# Patient Record
Sex: Female | Born: 1995 | Hispanic: No | Marital: Single | Smoking: Never smoker
Health system: Southern US, Community
[De-identification: ages and names within clinical notes are randomized; demographics above are authoritative.]

## PROBLEM LIST (undated history)

## (undated) DIAGNOSIS — Z309 Encounter for contraceptive management, unspecified: Secondary | ICD-10-CM

## (undated) HISTORY — DX: Encounter for contraceptive management, unspecified: Z30.9

---
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
## 2016-10-08 ENCOUNTER — Encounter: Payer: Self-pay | Admitting: Adult Health

## 2016-10-08 ENCOUNTER — Ambulatory Visit (INDEPENDENT_AMBULATORY_CARE_PROVIDER_SITE_OTHER): Payer: BLUE CROSS/BLUE SHIELD | Admitting: Adult Health

## 2016-10-08 VITALS — BP 132/60 | HR 82 | Ht 63.0 in | Wt 137.5 lb

## 2016-10-08 DIAGNOSIS — Z3041 Encounter for surveillance of contraceptive pills: Secondary | ICD-10-CM

## 2016-10-08 DIAGNOSIS — R635 Abnormal weight gain: Secondary | ICD-10-CM | POA: Diagnosis not present

## 2016-10-08 DIAGNOSIS — F329 Major depressive disorder, single episode, unspecified: Secondary | ICD-10-CM

## 2016-10-08 DIAGNOSIS — F32A Depression, unspecified: Secondary | ICD-10-CM

## 2016-10-08 MED ORDER — NORETHIN ACE-ETH ESTRAD-FE 1-20 MG-MCG PO TABS: 1.0000 | ORAL_TABLET | Freq: Every day | ORAL | 11 refills | Status: DC

## 2016-10-08 MED ORDER — ESCITALOPRAM OXALATE 10 MG PO TABS: 10.0000 mg | ORAL_TABLET | Freq: Every day | ORAL | 6 refills | Status: DC

## 2016-10-08 NOTE — Patient Instructions (Signed)
Major Depressive Disorder, Adult Major depressive disorder (MDD) is a mental health condition. It may also be called clinical depression or unipolar depression. MDD usually causes feelings of sadness, hopelessness, or helplessness. MDD can also cause physical symptoms. It can interfere with work, school, relationships, and other everyday activities. MDD may be mild, moderate, or severe. It may occur once (single episode major depressive disorder) or it may occur multiple times (recurrent major depressive disorder). What are the causes? The exact cause of this condition is not known. MDD is most likely caused by a combination of things, which may include:  Genetic factors. These are traits that are passed along from parent to child.  Individual factors. Your personality, your behavior, and the way you handle your thoughts and feelings may contribute to MDD. This includes personality traits and behaviors learned from others.  Physical factors, such as:  Differences in the part of your brain that controls emotion. This part of your brain may be different than it is in people who do not have MDD.  Long-term (chronic) medical or psychiatric illnesses.  Social factors. Traumatic experiences or major life changes may play a role in the development of MDD. What increases the risk? This condition is more likely to develop in women. The following factors may also make you more likely to develop MDD:  A family history of depression.  Troubled family relationships.  Abnormally low levels of certain brain chemicals.  Traumatic events in childhood, especially abuse or the loss of a parent.  Being under a lot of stress, or long-term stress, especially from upsetting life experiences or losses.  A history of:  Chronic physical illness.  Other mental health disorders.  Substance abuse.  Poor living conditions.  Experiencing social exclusion or discrimination on a regular basis. What are the  signs or symptoms? The main symptoms of MDD typically include:  Constant depressed or irritable mood.  Loss of interest in things and activities. MDD symptoms may also include:  Sleeping or eating too much or too little.  Unexplained weight change.  Fatigue or low energy.  Feelings of worthlessness or guilt.  Difficulty thinking clearly or making decisions.  Thoughts of suicide or of harming others.  Physical agitation or weakness.  Isolation. Severe cases of MDD may also occur with other symptoms, such as:  Delusions or hallucinations, in which you imagine things that are not real (psychotic depression).  Low-level depression that lasts at least a year (chronic depression or persistent depressive disorder).  Extreme sadness and hopelessness (melancholic depression).  Trouble speaking and moving (catatonic depression). How is this diagnosed? This condition may be diagnosed based on:  Your symptoms.  Your medical history, including your mental health history. This may involve tests to evaluate your mental health. You may be asked questions about your lifestyle, including any drug and alcohol use, and how long you have had symptoms of MDD.  A physical exam.  Blood tests to rule out other conditions. You must have a depressed mood and at least four other MDD symptoms most of the day, nearly every day in the same 2-week timeframe before your health care provider can confirm a diagnosis of MDD. How is this treated? This condition is usually treated by mental health professionals, such as psychologists, psychiatrists, and clinical social workers. You may need more than one type of treatment. Treatment may include:  Psychotherapy. This is also called talk therapy or counseling. Types of psychotherapy include:  Cognitive behavioral therapy (CBT). This type of therapy  teaches you to recognize unhealthy feelings, thoughts, and behaviors, and replace them with positive thoughts  and actions.  Interpersonal therapy (IPT). This helps you to improve the way you relate to and communicate with others.  Family therapy. This treatment includes members of your family.  Medicine to treat anxiety and depression, or to help you control certain emotions and behaviors.  Lifestyle changes, such as:  Limiting alcohol and drug use.  Exercising regularly.  Getting plenty of sleep.  Making healthy eating choices.  Spending more time outdoors. Treatments involving stimulation of the brain can be used in situations with extremely severe symptoms, or when medicine or other therapies do not work over time. These treatments include electroconvulsive therapy, transcranial magnetic stimulation, and vagal nerve stimulation. Follow these instructions at home: Activity  Return to your normal activities as told by your health care provider.  Exercise regularly and spend time outdoors as told by your health care provider. General instructions  Take over-the-counter and prescription medicines only as told by your health care provider.  Do not drink alcohol. If you drink alcohol, limit your alcohol intake to no more than 1 drink a day for nonpregnant women and 2 drinks a day for men. One drink equals 12 oz of beer, 5 oz of wine, or 1 oz of hard liquor. Alcohol can affect any antidepressant medicines you are taking. Talk to your health care provider about your alcohol use.  Eat a healthy diet and get plenty of sleep.  Find activities that you enjoy doing, and make time to do them.  Consider joining a support group. Your health care provider may be able to recommend a support group.  Keep all follow-up visits as told by your health care provider. This is important. Where to find more information: [employer]ness  www.nami.org [employer]  http://www.maynard.net/www.nimh.nih.gov National Suicide Prevention Lifeline  1-800-273-TALK 913-551-4627(8255). This is  free, 24-hour help. Contact a health care provider if:  Your symptoms get worse.  You develop new symptoms. Get help right away if:  You self-harm.  You have serious thoughts about hurting yourself or others.  You see, hear, taste, smell, or feel things that are not present (hallucinate). This information is not intended to replace advice given to you by your health care provider. Make sure you discuss any questions you have with your health care provider. Document Released: 02/23/2013 Document Revised: 07/05/2016 Document Reviewed: 05/09/2016 Elsevier Interactive Patient Education  2017 [employer] Take 1/2 tab of lexparo for 3-4 days then 1 daily Follow up in 5 weeks

## 2016-10-08 NOTE — Progress Notes (Signed)
Subjective:     Patient ID: Raven Collins, female   DOB: [birth year], 20 y.o.   MRN: [id number]  HPI Raven Collins is a 20 year old female in to get refills on birth control and she has weight gain and wants thyroid checked,has always had some constipation issues.She says it runs in her family.She is happy with her birth control.  Review of Systems +weight gain Always has some constipation Reviewed past medical,surgical, social and family history. Reviewed medications and allergies.     Objective:   Physical Exam BP 132/60 (BP Location: Left Arm, Patient Position: Sitting, Cuff Size: Normal)   Pulse 82   Ht 5\' 3"  (1.6 m)   Wt 137 lb 8 oz (62.4 kg)   LMP 09/11/2016 (Approximate)   BMI 24.36 kg/m  Skin warm and dry. Neck: mid line trachea, normal thyroid, good ROM, no lymphadenopathy noted. Lungs: clear to ausculation bilaterally. Cardiovascular: regular rate and rhythm. PHQ 9 score 11, pt denies any suicidal ideations, but has had 2 deaths in family in last year and broke up with boyfriend and is living with friends. Discussed medication and counseling and will start with meds. She says depression runs in her family too. She is going to [place] in mid December to move her Mom here.     Assessment:     1. Encounter for surveillance of contraceptive pills   2. Weight gain   3. Depression, unspecified depression type       Plan:     Check TSH and free T4 Meds ordered this encounter  Medications  . norethindrone-ethinyl estradiol (JUNEL FE 1/20) 1-20 MG-MCG tablet    Sig: Take 1 tablet by mouth daily.    Dispense:  1 Package    Refill:  11    Order Specific Question:   Supervising Provider    Answer:   Despina HiddenEURE, LUTHER H [[id number]]  . escitalopram (LEXAPRO) 10 MG tablet    Sig: Take 1 tablet (10 mg total) by mouth daily.    Dispense:  30 tablet    Refill:  6    Order Specific Question:   Supervising Provider    Answer:   Duane LopeEURE, LUTHER H [[id number]]  Take 1/2 tab lexapro first 3-4 days then 1  daily Follow  Up in 5 weeks, will offer counseling again  Review handout on depression

## 2016-10-09 LAB — T4, FREE: FREE T4: 1.35 ng/dL (ref 0.93–1.60)

## 2016-10-09 LAB — TSH: TSH: 1.76 u[IU]/mL (ref 0.450–4.500)

## 2016-11-14 ENCOUNTER — Ambulatory Visit: Payer: BLUE CROSS/BLUE SHIELD | Admitting: Adult Health

## 2017-06-23 ENCOUNTER — Other Ambulatory Visit: Payer: Self-pay | Admitting: Adult Health

## 2019-12-24 ENCOUNTER — Encounter: Payer: BLUE CROSS/BLUE SHIELD | Admitting: Obstetrics and Gynecology

## 2020-01-06 ENCOUNTER — Encounter: Payer: BLUE CROSS/BLUE SHIELD | Admitting: Obstetrics and Gynecology

## 2020-01-07 ENCOUNTER — Ambulatory Visit (INDEPENDENT_AMBULATORY_CARE_PROVIDER_SITE_OTHER): Payer: BC Managed Care – PPO | Admitting: Obstetrics and Gynecology

## 2020-01-07 ENCOUNTER — Other Ambulatory Visit: Payer: Self-pay

## 2020-01-07 ENCOUNTER — Encounter: Payer: Self-pay | Admitting: Obstetrics and Gynecology

## 2020-01-07 ENCOUNTER — Other Ambulatory Visit (HOSPITAL_COMMUNITY)
Admission: RE | Admit: 2020-01-07 | Discharge: 2020-01-07 | Disposition: A | Discharge status: Home / Self Care | Payer: BC Managed Care – PPO | Source: Ambulatory Visit | Attending: Obstetrics and Gynecology | Admitting: Obstetrics and Gynecology

## 2020-01-07 VITALS — BP 125/78 | HR 69 | Ht 64.0 in | Wt 163.0 lb

## 2020-01-07 DIAGNOSIS — Z8619 Personal history of other infectious and parasitic diseases: Secondary | ICD-10-CM | POA: Insufficient documentation

## 2020-01-07 DIAGNOSIS — N921 Excessive and frequent menstruation with irregular cycle: Secondary | ICD-10-CM | POA: Diagnosis not present

## 2020-01-07 DIAGNOSIS — R87619 Unspecified abnormal cytological findings in specimens from cervix uteri: Secondary | ICD-10-CM | POA: Insufficient documentation

## 2020-01-07 DIAGNOSIS — Z975 Presence of (intrauterine) contraceptive device: Secondary | ICD-10-CM

## 2020-01-07 NOTE — Addendum Note (Signed)
Addended by: Dorian Pod on: 01/07/2020 03:13 PM   Modules accepted: Orders

## 2020-01-07 NOTE — Progress Notes (Signed)
HPI:      Raven Collins is a 24 y.o. G0P0000 who LMP was No LMP recorded. (Menstrual status: IUD).  Subjective:   She presents today with 3 issues.  The first issue is her Mirena IUD.  She had significant bleeding and cramping upon placement to the point where she could not get out of bed.  Over time this has subsided.  Her previous doctor placed her on OCPs to stop the bleeding and this has improved her pain.  She still bleeds a few days per month but is currently on OCPs.  She will finish this pack and then be off of them. The second issue is she was previously diagnosed with chlamydia and treated appropriately as was her partner.  She has not had a test of cure. The third issue is an abnormal Pap smear which showed atypical glandular cells.  This has not been followed up.    Hx: The following portions of the patient's history were reviewed and updated as appropriate:             She  has a past medical history of Contraceptive management (08/29/2015). She does not have any pertinent problems on file. She  has a past surgical history that includes Foot surgery (Left, 2012). Her family history includes Alcohol abuse in her maternal aunt, maternal uncle, maternal uncle, and maternal uncle; Depression in her maternal aunt, maternal uncle, and maternal uncle; Migraines in her mother; Other in her brother and maternal grandmother; Thyroid disease in her maternal grandmother and mother. She  reports that she has never smoked. She has never used smokeless tobacco. She reports current alcohol use. She reports that she does not use drugs. She currently has no medications in their medication list. She has No Known Allergies.       Review of Systems:  Review of Systems  Constitutional: Denied constitutional symptoms, night sweats, recent illness, fatigue, fever, insomnia and weight loss.  Eyes: Denied eye symptoms, eye pain, photophobia, vision change and visual disturbance.  Ears/Nose/Throat/Neck:  Denied ear, nose, throat or neck symptoms, hearing loss, nasal discharge, sinus congestion and sore throat.  Cardiovascular: Denied cardiovascular symptoms, arrhythmia, chest pain/pressure, edema, exercise intolerance, orthopnea and palpitations.  Respiratory: Denied pulmonary symptoms, asthma, pleuritic pain, productive sputum, cough, dyspnea and wheezing.  Gastrointestinal: Denied, gastro-esophageal reflux, melena, nausea and vomiting.  Genitourinary: See HPI for additional information.  Musculoskeletal: Denied musculoskeletal symptoms, stiffness, swelling, muscle weakness and myalgia.  Dermatologic: Denied dermatology symptoms, rash and scar.  Neurologic: Denied neurology symptoms, dizziness, headache, neck pain and syncope.  Psychiatric: Denied psychiatric symptoms, anxiety and depression.  Endocrine: Denied endocrine symptoms including hot flashes and night sweats.   Meds:   No current outpatient medications on file prior to visit.   No current facility-administered medications on file prior to visit.    Objective:     Vitals:   01/07/20 1420  BP: 125/78  Pulse: 69              Physical examination   Pelvic:   Vulva: Normal appearance.  No lesions.  Vagina: No lesions or abnormalities noted.  Support: Normal pelvic support.  Urethra No masses tenderness or scarring.  Meatus Normal size without lesions or prolapse.  Cervix: Normal appearance.  No lesions. IUD strings noted at cervical os.  Anus: Normal exam.  No lesions.  Perineum: Normal exam.  No lesions.        Bimanual   Uterus: Normal size.  Non-tender.  Mobile.  AV.  Adnexae: No masses.  Non-tender to palpation.  Cul-de-sac: Negative for abnormality.     Assessment:    G0P0000 Patient Active Problem List   Diagnosis Date Noted  . Contraceptive management 08/29/2015  . PLANTAR WART, RIGHT 09/15/2008     1. 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 History of  chlamydia   3. Breakthrough bleeding associated with intrauterine device (IUD)        Plan:            1.  Pap performed  2.  GC/CT performed with Pap for test of cure  3.  Expectant management after stopping OCPs.  If bleeding is minimal and no pain with IUD will leave in place.  If pain and bleeding resumed consider ultrasound versus removal and replacement.   Orders No orders of the defined types were placed in this encounter.   No orders of the defined types were placed in this encounter.     F/U  Return for We will contact her with any abnormal test results. I spent 32 minutes involved in the care of this patient preparing to see the patient by obtaining and reviewing her medical history (including labs, imaging tests and prior procedures), documenting clinical information in the electronic health record (EHR), counseling and coordinating care plans, writing and sending prescriptions, ordering tests or procedures and directly communicating with the patient by discussing pertinent items from her history and physical exam as well as detailing my assessment and plan as noted above so that she has an informed understanding.  All of her questions were answered.  Elonda Husky, M.D. 01/07/2020 3:01 PM

## 2020-01-11 LAB — CYTOLOGY - PAP
Chlamydia: NEGATIVE
Comment: NEGATIVE
Comment: NORMAL
Diagnosis: NEGATIVE
Neisseria Gonorrhea: NEGATIVE

## 2020-01-18 ENCOUNTER — Telehealth: Payer: Self-pay | Admitting: Obstetrics and Gynecology

## 2020-01-18 NOTE — Telephone Encounter (Signed)
Spoke with patient to let her know that we did not send any medication in. Patient stated that her pharmacy called and said that RX was ready for pick up. I advised patient to call her pharmacy to discuss what medication it is and who sent in for her. Patient verbalized understanding.

## 2020-01-18 NOTE — Telephone Encounter (Signed)
Pt called and stated that a medication was sent in to her pharmacy. The pt is confused because she was never told about a medication/ pt is requesting a call back. Please advise

## 2020-03-03 ENCOUNTER — Ambulatory Visit: Payer: BC Managed Care – PPO

## 2020-03-03 ENCOUNTER — Ambulatory Visit: Payer: BC Managed Care – PPO | Attending: Internal Medicine

## 2020-03-03 DIAGNOSIS — Z23 Encounter for immunization: Secondary | ICD-10-CM

## 2020-03-03 NOTE — Progress Notes (Signed)
   Covid-19 Vaccination Clinic  Name:  Raven Collins    MRN: [id number] DOB: 1995-12-08  03/03/2020  [name] was observed post Covid-19 immunization for 15 minutes without incident. She was provided with Vaccine Information Sheet and instruction to access the V-Safe system.   [name] was instructed to call 911 with any severe reactions post vaccine: Marland Kitchen Difficulty breathing  . Swelling of face and throat  . A fast heartbeat  . A bad rash all over body  . Dizziness and weakness   Immunizations Administered    Name Date Dose VIS Date Route   Pfizer COVID-19 Vaccine 03/03/2020  8:26 AM 0.3 mL 01/06/2019 Intramuscular   Manufacturer: [employer], [employer]   Lot: [id number]   NDC: [address]-0

## 2020-03-29 ENCOUNTER — Ambulatory Visit: Payer: BC Managed Care – PPO | Attending: Internal Medicine

## 2020-03-29 DIAGNOSIS — Z23 Encounter for immunization: Secondary | ICD-10-CM

## 2020-03-29 NOTE — Progress Notes (Signed)
   Covid-19 Vaccination Clinic  Name:  Raven Collins    MRN: [id number] DOB: [birth year]  03/29/2020  [name] was observed post Covid-19 immunization for 15 minutes without incident. She was provided with Vaccine Information Sheet and instruction to access the V-Safe system.   [name] was instructed to call 911 with any severe reactions post vaccine: Marland Kitchen Difficulty breathing  . Swelling of face and throat  . A fast heartbeat  . A bad rash all over body  . Dizziness and weakness   Immunizations Administered    Name Date Dose VIS Date Route   Pfizer COVID-19 Vaccine 03/29/2020 11:53 AM 0.3 mL 01/06/2019 Intramuscular   Manufacturer: [employer], [employer]   Lot: [id number]   NDC: [address]-5

## 2020-10-26 ENCOUNTER — Encounter: Payer: BC Managed Care – PPO | Admitting: Obstetrics and Gynecology

## 2020-10-27 ENCOUNTER — Encounter: Payer: Self-pay | Admitting: Obstetrics and Gynecology

## 2020-10-27 ENCOUNTER — Ambulatory Visit (INDEPENDENT_AMBULATORY_CARE_PROVIDER_SITE_OTHER): Payer: BC Managed Care – PPO | Admitting: Obstetrics and Gynecology

## 2020-10-27 ENCOUNTER — Other Ambulatory Visit (HOSPITAL_COMMUNITY)
Admission: RE | Admit: 2020-10-27 | Discharge: 2020-10-27 | Disposition: A | Discharge status: Home / Self Care | Payer: BC Managed Care – PPO | Source: Ambulatory Visit | Attending: Obstetrics and Gynecology | Admitting: Obstetrics and Gynecology

## 2020-10-27 ENCOUNTER — Other Ambulatory Visit: Payer: Self-pay

## 2020-10-27 VITALS — BP 114/75 | HR 75 | Ht 64.0 in | Wt 171.3 lb

## 2020-10-27 DIAGNOSIS — Z01419 Encounter for gynecological examination (general) (routine) without abnormal findings: Secondary | ICD-10-CM | POA: Diagnosis not present

## 2020-10-27 DIAGNOSIS — R5383 Other fatigue: Secondary | ICD-10-CM

## 2020-10-27 DIAGNOSIS — Z118 Encounter for screening for other infectious and parasitic diseases: Secondary | ICD-10-CM | POA: Insufficient documentation

## 2020-10-27 DIAGNOSIS — N921 Excessive and frequent menstruation with irregular cycle: Secondary | ICD-10-CM | POA: Diagnosis not present

## 2020-10-27 DIAGNOSIS — Z975 Presence of (intrauterine) contraceptive device: Secondary | ICD-10-CM

## 2020-10-27 NOTE — Progress Notes (Signed)
HPI:      [name] is a 24 y.o. G0P0000 who LMP was No LMP recorded. (Menstrual status: IUD).  Subjective:   She presents today for her annual examination.  She has Mirena IUD for birth control.  She continues to experience at least twice monthly breakthrough bleeding with IUD.  She also has intermittent cramping sometimes associated with the bleeding and sometimes not.  She is not overly happy with her IUD although it is "manageable". Patient desires STD testing-she denies symptoms, denies new sexual partner. Complains of generalized fatigue.  States that no matter how much she sleeps she still feels tired every day.  Also complains of unintended weight gain.  Reports that she is actually eating more healthy than she used to but continues to gain weight.   Hx: The following portions of the patient's history were reviewed and updated as appropriate:             She  has a past medical history of Contraceptive management (08/29/2015). She does not have any pertinent problems on file. She  has a past surgical history that includes Foot surgery (Left, 2012). Her family history includes Alcohol abuse in her maternal aunt, maternal uncle, maternal uncle, and maternal uncle; Depression in her maternal aunt, maternal uncle, and maternal uncle; Migraines in her mother; Other in her brother and maternal grandmother; Thyroid disease in her maternal grandmother and mother. She  reports that she has never smoked. She has never used smokeless tobacco. She reports current alcohol use. She reports that she does not use drugs. She currently has no medications in their medication list. She has No Known Allergies.       Review of Systems:  Review of Systems  Constitutional: Denied constitutional symptoms, night sweats, recent illness, fatigue, fever, insomnia and weight loss.  Eyes: Denied eye symptoms, eye pain, photophobia, vision change and visual disturbance.  Ears/Nose/Throat/Neck: Denied ear, nose,  throat or neck symptoms, hearing loss, nasal discharge, sinus congestion and sore throat.  Cardiovascular: Denied cardiovascular symptoms, arrhythmia, chest pain/pressure, edema, exercise intolerance, orthopnea and palpitations.  Respiratory: Denied pulmonary symptoms, asthma, pleuritic pain, productive sputum, cough, dyspnea and wheezing.  Gastrointestinal: Denied, gastro-esophageal reflux, melena, nausea and vomiting.  Genitourinary: See HPI for additional information.  Musculoskeletal: Denied musculoskeletal symptoms, stiffness, swelling, muscle weakness and myalgia.  Dermatologic: Denied dermatology symptoms, rash and scar.  Neurologic: Denied neurology symptoms, dizziness, headache, neck pain and syncope.  Psychiatric: Denied psychiatric symptoms, anxiety and depression.  Endocrine: See HPI for additional information.   Meds:   No current outpatient medications on file prior to visit.   No current facility-administered medications on file prior to visit.       The pregnancy intention screening data noted above was reviewed. Potential methods of contraception were discussed. The patient elected to proceed with IUD or IUS.     Objective:     Vitals:   10/27/20 0911  BP: 114/75  Pulse: 75    Filed Weights   10/27/20 0911  Weight: 171 lb 4.8 oz (77.7 kg)              Physical examination General NAD, Conversant  HEENT Atraumatic; Op clear with mmm.  Normo-cephalic. Pupils reactive. Anicteric sclerae  Thyroid/Neck Smooth without nodularity or enlargement. Normal ROM.  Neck Supple.  Skin No rashes, lesions or ulceration. Normal palpated skin turgor. No nodularity.  Breasts: No masses or discharge.  Symmetric.  No axillary adenopathy.  Lungs: Clear to auscultation.No rales or  wheezes. Normal Respiratory effort, no retractions.  Heart: NSR.  No murmurs or rubs appreciated. No periferal edema  Abdomen: Soft.  Non-tender.  No masses.  No HSM. No hernia  Extremities: Moves all  appropriately.  Normal ROM for age. No lymphadenopathy.  Neuro: Oriented to PPT.  Normal mood. Normal affect.     Pelvic:   Vulva: Normal appearance.  No lesions.  Vagina: No lesions or abnormalities noted.  Support: Normal pelvic support.  Urethra No masses tenderness or scarring.  Meatus Normal size without lesions or prolapse.  Cervix: Normal appearance.  No lesions.  IUD strings noted at the cervical os  Anus: Normal exam.  No lesions.  Perineum: Normal exam.  No lesions.        Bimanual   Uterus: Normal size.  Non-tender.  Mobile.  AV.  Adnexae: No masses.  Non-tender to palpation.  Cul-de-sac: Negative for abnormality.      Assessment:    G0P0000 Patient Active Problem List   Diagnosis Date Noted  . Contraceptive management 08/29/2015  . PLANTAR WART, RIGHT 09/15/2008     1. Well woman exam with routine gynecological exam   2. Fatigue, unspecified type   3. Breakthrough bleeding associated with intrauterine device (IUD)     Regarding her breakthrough bleeding with IUD, I still believe that the IUD is probably malpositioned.  This is based on the patient's history of significant cramping for multiple days after insertion as well as her continued cramping and irregular bleeding.  Patient has a family history of thyroid issues and is gaining weight and feels tired all the time.    Plan:            1.  Basic Screening Recommendations The basic screening recommendations for asymptomatic women were discussed with the patient during her visit.  The age-appropriate recommendations were discussed with her and the rational for the tests reviewed.  When I am informed by the patient that another primary care physician has previously obtained the age-appropriate tests and they are up-to-date, only outstanding tests are ordered and referrals given as necessary.  Abnormal results of tests will be discussed with her when all of her results are completed.  Routine preventative health  maintenance measures emphasized: Exercise/Diet/Weight control, Tobacco Warnings, Alcohol/Substance use risks and Stress Management GC/CT performed 2.  Ultrasound for IUD position 3.  TSH, CBC and prolactin for evaluation of fatigue.  Orders Orders Placed This Encounter  Procedures  . US PELVIS (TRANSABDOMINAL ONLY)  . US PELVIS TRANSVAGINAL NON-OB (TV ONLY)  . TSH  . Prolactin  . CBC    No orders of the defined types were placed in this encounter.         F/U  Return for We will contact her with any abnormal test results. I spent 31 minutes involved in the care of this patient preparing to see the patient by obtaining and reviewing her medical history (including labs, imaging tests and prior procedures), documenting clinical information in the electronic health record (EHR), counseling and coordinating care plans, writing and sending prescriptions, ordering tests or procedures and directly communicating with the patient by discussing pertinent items from her history and physical exam as well as detailing my assessment and plan as noted above so that she has an informed understanding.  All of her questions were answered.  A significant portion of this time was taken dealing with issues relating to her fatigue, weight gain and breakthrough bleeding with IUD.  Elonda Husky, M.D. 10/27/2020  9:41 AM

## 2020-10-27 NOTE — Addendum Note (Signed)
Addended by: Dorian Pod on: 10/27/2020 10:16 AM   Modules accepted: Orders

## 2020-10-28 LAB — CERVICOVAGINAL ANCILLARY ONLY
Bacterial Vaginitis (gardnerella): POSITIVE — AB
Candida Glabrata: NEGATIVE
Candida Vaginitis: POSITIVE — AB
Chlamydia: NEGATIVE
Comment: NEGATIVE
Comment: NEGATIVE
Comment: NEGATIVE
Comment: NEGATIVE
Comment: NORMAL
Neisseria Gonorrhea: NEGATIVE

## 2020-10-28 LAB — CBC
Hematocrit: 37.3 % (ref 34.0–46.6)
Hemoglobin: 12.1 g/dL (ref 11.1–15.9)
MCH: 29.1 pg (ref 26.6–33.0)
MCHC: 32.4 g/dL (ref 31.5–35.7)
MCV: 90 fL (ref 79–97)
Platelets: 233 10*3/uL (ref 150–450)
RBC: 4.16 x10E6/uL (ref 3.77–5.28)
RDW: 11.8 % (ref 11.7–15.4)
WBC: 6.3 10*3/uL (ref 3.4–10.8)

## 2020-10-28 LAB — TSH: TSH: 1.54 u[IU]/mL (ref 0.450–4.500)

## 2020-10-28 LAB — PROLACTIN: Prolactin: 19.1 ng/mL (ref 4.8–23.3)

## 2020-11-01 ENCOUNTER — Telehealth: Payer: Self-pay

## 2020-11-01 MED ORDER — METRONIDAZOLE 500 MG PO TABS: 500.0000 mg | ORAL_TABLET | Freq: Two times a day (BID) | ORAL | 0 refills | Status: DC

## 2020-11-01 MED ORDER — FLUCONAZOLE 150 MG PO TABS: 150.0000 mg | ORAL_TABLET | Freq: Once | ORAL | 0 refills | Status: AC

## 2020-11-01 NOTE — Telephone Encounter (Signed)
-----   Message from Linzie Collin, MD sent at 11/01/2020  9:42 AM EST ----- Dx = yeast - needs Rx: Diflucan 150mg   1 PO now DX = BV - needs Rx: Flagyl 500mg   1 PO BID X7 days

## 2020-11-01 NOTE — Telephone Encounter (Signed)
Informed patient of positive vaginal culture per Dr Logan Bores. Pharmacy confirmed with patient. Scripts sent

## 2020-11-10 ENCOUNTER — Ambulatory Visit (INDEPENDENT_AMBULATORY_CARE_PROVIDER_SITE_OTHER): Payer: BC Managed Care – PPO

## 2020-11-10 ENCOUNTER — Other Ambulatory Visit: Payer: Self-pay

## 2020-11-10 DIAGNOSIS — N921 Excessive and frequent menstruation with irregular cycle: Secondary | ICD-10-CM

## 2020-11-10 DIAGNOSIS — Z975 Presence of (intrauterine) contraceptive device: Secondary | ICD-10-CM | POA: Diagnosis not present

## 2022-05-29 ENCOUNTER — Ambulatory Visit: Payer: 59 | Admitting: Radiology

## 2022-05-29 ENCOUNTER — Encounter: Payer: Self-pay | Admitting: Radiology

## 2022-05-29 VITALS — BP 120/72 | Ht 64.0 in | Wt 164.0 lb

## 2022-05-29 DIAGNOSIS — Z113 Encounter for screening for infections with a predominantly sexual mode of transmission: Secondary | ICD-10-CM

## 2022-05-29 DIAGNOSIS — Z01419 Encounter for gynecological examination (general) (routine) without abnormal findings: Secondary | ICD-10-CM | POA: Diagnosis not present

## 2022-05-29 NOTE — Progress Notes (Signed)
   Raven Collins [birth year] [id number]   History:  26 y.o. G0 presents for annual exam.Treated for pyelo 09/2021.  Noticed blisters on thigh and upper back off and on 4 times since 09/2021 (no lesions currently). Each episode started with a high fever.Desires full STI screening. Hx of chlamydia.   Gynecologic History No LMP recorded. (Menstrual status: IUD).   Contraception/Family planning: IUD Sexually active: yes Last Pap: 01/07/20. Results were: normal Last mammogram: n/a  Obstetric History OB History  Gravida Para Term Preterm AB Living  0 0 0 0 0 0  SAB IAB Ectopic Multiple Live Births  0 0 0 0       The following portions of the patient's history were reviewed and updated as appropriate: allergies, current medications, past family history, past medical history, past social history, past surgical history, and problem list.  Review of Systems Pertinent items noted in HPI and remainder of comprehensive ROS otherwise negative.   Past medical history, past surgical history, family history and social history were all reviewed and documented in the EPIC chart.   Exam:  Vitals:   05/29/22 1019  BP: 120/72  Weight: 164 lb (74.4 kg)  Height: 5\' 4"  (1.626 m)   Body mass index is 28.15 kg/m.  General appearance:  Normal Thyroid:  Symmetrical, normal in size, without palpable masses or nodularity. Respiratory  Auscultation:  Clear without wheezing or rhonchi Cardiovascular  Auscultation:  Regular rate, without rubs, murmurs or gallops  Edema/varicosities:  Not grossly evident Abdominal  Soft,nontender, without masses, guarding or rebound.  Liver/spleen:  No organomegaly noted  Hernia:  None appreciated  Skin  Inspection:  Grossly normal Breasts: Examined lying and sitting.   Right: Without masses, retractions, nipple discharge or axillary adenopathy.   Left: Without masses, retractions, nipple discharge or axillary adenopathy. Genitourinary   Inguinal/mons:  Normal  without inguinal adenopathy  External genitalia:  Normal appearing vulva with no masses, tenderness, or lesions  BUS/Urethra/Skene's glands:  Normal without masses or exudate  Vagina:  Normal appearing with normal color and discharge, no lesions  Cervix:  Normal appearing without discharge or lesions  Uterus:  Normal in size, shape and contour.  Mobile, nontender  Adnexa/parametria:     Rt: Normal in size, without masses or tenderness.   Lt: Normal in size, without masses or tenderness.  Anus and perineum: Normal   Patient informed chaperone available to be present for breast and pelvic exam. Patient has requested no chaperone to be present. Patient has been advised what will be completed during breast and pelvic exam.   Assessment/Plan:   1. Well woman exam with routine gynecological exam Pap due 2024 Mirena may remain until 2028  2. Screening for STDs (sexually transmitted diseases) Bring back for culture of lesions with next occurrence, pt agreeable  - HIV antibody (with reflex) - RPR - HSV(herpes simplex vrs) 1+2 ab-IgG - SURESWAB CT/NG/T. vaginalis    Discussed SBE, pap and STI screening as directed/appropriate. Recommend 2029 of exercise weekly, including weight bearing exercise. Encouraged the use of seatbelts and sunscreen. Return in 1 year for annual or as needed.   B WHNP-BC 10:44 AM 05/29/2022

## 2022-05-30 LAB — HIV ANTIBODY (ROUTINE TESTING W REFLEX): HIV 1&2 Ab, 4th Generation: NONREACTIVE

## 2022-05-30 LAB — SURESWAB CT/NG/T. VAGINALIS
C. trachomatis RNA, TMA: NOT DETECTED
N. gonorrhoeae RNA, TMA: NOT DETECTED
Trichomonas vaginalis RNA: NOT DETECTED

## 2022-05-30 LAB — HSV(HERPES SIMPLEX VRS) I + II AB-IGG
HAV 1 IGG,TYPE SPECIFIC AB: 1.01 index — ABNORMAL HIGH
HSV 2 IGG,TYPE SPECIFIC AB: >23 index — ABNORMAL HIGH

## 2022-05-30 LAB — RPR: RPR Ser Ql: NONREACTIVE

## 2022-10-03 ENCOUNTER — Ambulatory Visit: Payer: 59 | Admitting: Radiology

## 2022-10-03 VITALS — BP 112/62

## 2022-10-03 DIAGNOSIS — Z113 Encounter for screening for infections with a predominantly sexual mode of transmission: Secondary | ICD-10-CM

## 2022-10-03 NOTE — Progress Notes (Signed)
      Subjective: Raven Collins is a 26 y.o. female here for STI screening, 1 new partner, 1 encounter without a condom. Asymptomatic.    Review of Systems  All other systems reviewed and are negative.   Past Medical History:  Diagnosis Date   Contraceptive management 08/29/2015      Objective:  Today's Vitals   10/03/22 1356  BP: 112/62   There is no height or weight on file to calculate BMI.   -General: no acute distress -Vulva: without lesions or discharge -Vagina: discharge present, aptima swab obtained -Cervix: no lesion or discharge, no CMT -Perineum: no lesions -Uterus: Mobile, non tender -Adnexa: no masses or tenderness    Chaperone offered and declined.  Assessment:/Plan:  1. Screening for STDs (sexually transmitted diseases) - Safe sex encouraged  - SURESWAB CT/NG/T. vaginalis    Will contact patient with results of testing completed today. Avoid intercourse until symptoms are resolved. Safe sex encouraged. Avoid the use of soaps or perfumed products in the peri area. Avoid tub baths and sitting in sweaty or wet clothing for prolonged periods of time.

## 2022-10-04 LAB — SURESWAB CT/NG/T. VAGINALIS
C. trachomatis RNA, TMA: DETECTED — AB
N. gonorrhoeae RNA, TMA: NOT DETECTED
Trichomonas vaginalis RNA: NOT DETECTED

## 2022-10-09 ENCOUNTER — Other Ambulatory Visit: Payer: Self-pay | Admitting: *Deleted

## 2022-10-09 MED ORDER — DOXYCYCLINE HYCLATE 100 MG PO CAPS: 100.0000 mg | ORAL_CAPSULE | Freq: Two times a day (BID) | ORAL | 0 refills | Status: DC

## 2022-10-19 MED ORDER — FLUCONAZOLE 150 MG PO TABS: 150.0000 mg | ORAL_TABLET | Freq: Once | ORAL | 0 refills | Status: AC

## 2022-10-19 NOTE — Telephone Encounter (Signed)
Ok to send diflucan 150mg  #1

## 2022-10-24 ENCOUNTER — Ambulatory Visit
Admission: EM | Admit: 2022-10-24 | Discharge: 2022-10-24 | Disposition: A | Discharge status: Home / Self Care | Payer: 59 | Attending: Urgent Care | Admitting: Urgent Care

## 2022-10-24 DIAGNOSIS — B349 Viral infection, unspecified: Secondary | ICD-10-CM

## 2022-10-24 DIAGNOSIS — J101 Influenza due to other identified influenza virus with other respiratory manifestations: Secondary | ICD-10-CM | POA: Insufficient documentation

## 2022-10-24 DIAGNOSIS — Z1152 Encounter for screening for COVID-19: Secondary | ICD-10-CM | POA: Insufficient documentation

## 2022-10-24 LAB — RESP PANEL BY RT-PCR (FLU A&B, COVID) ARPGX2
Influenza A by PCR: POSITIVE — AB
Influenza B by PCR: NEGATIVE
SARS Coronavirus 2 by RT PCR: NEGATIVE

## 2022-10-24 MED ORDER — CETIRIZINE HCL 10 MG PO TABS: 10.0000 mg | ORAL_TABLET | Freq: Every day | ORAL | 0 refills | Status: DC

## 2022-10-24 MED ORDER — PROMETHAZINE-DM 6.25-15 MG/5ML PO SYRP: 2.5000 mL | ORAL_SOLUTION | Freq: Three times a day (TID) | ORAL | 0 refills | Status: DC | PRN

## 2022-10-24 MED ORDER — IBUPROFEN 600 MG PO TABS: 600.0000 mg | ORAL_TABLET | Freq: Four times a day (QID) | ORAL | 0 refills | Status: DC | PRN

## 2022-10-24 MED ORDER — PSEUDOEPHEDRINE HCL 60 MG PO TABS: 60.0000 mg | ORAL_TABLET | Freq: Three times a day (TID) | ORAL | 0 refills | Status: DC | PRN

## 2022-10-24 NOTE — ED Provider Notes (Signed)
[facility]  Note:  This document was prepared using Systems analyst and may include unintentional dictation errors.  MRN: [id number] DOB: 03/21/1996  Subjective:   Raven Collins is a 26 y.o. female presenting for 5-day history of malaise, chills, decreased appetite, frontal headache and pain/pressure behind her eyes and in her ears.  Has also had some coughing.  Patient would like a flu test.  She is otherwise in good health.  She did finish a course of doxycycline for treatment of chlamydia just prior to becoming ill.  She is also undergoing treatment for yeast vaginitis.  No urinary symptoms, body pains, chest pain, shortness of breath.  No smoking.  No history of respiratory disorders.  No current facility-administered medications for this encounter.  Current Outpatient Medications:    levonorgestrel (MIRENA) 20 MCG/DAY IUD, 1 each by Intrauterine route once. Inserted 09/2019, Disp: , Rfl:    No Known Allergies  Past Medical History:  Diagnosis Date   Contraceptive management 08/29/2015     Past Surgical History:  Procedure Laterality Date   FOOT SURGERY Left 2012    Family History  Problem Relation Age of Onset   Thyroid disease Mother    Migraines Mother    Alcohol abuse Maternal Aunt    Depression Maternal Aunt    Alcohol abuse Maternal Uncle    Depression Maternal Uncle    Thyroid disease Maternal Grandmother    Other Maternal Grandmother        aorta aneursym   Other Brother        hit by car   Alcohol abuse Maternal Uncle    Depression Maternal Uncle    Alcohol abuse Maternal Uncle     Social History   Tobacco Use   Smoking status: Never   Smokeless tobacco: Never  Substance Use Topics   Alcohol use: Yes    Comment: occ   Drug use: No    ROS   Objective:   Vitals: BP 105/79 (BP Location: Right Arm)   Pulse 81   Temp 98.7 F (37.1 C) (Oral)   Resp 20   Ht 5\' 3"  (1.6 m)   Wt 150 lb (68 kg)   SpO2  99%   BMI 26.57 kg/m   Physical Exam Constitutional:      General: She is not in acute distress.    Appearance: Normal appearance. She is well-developed and normal weight. She is not ill-appearing, toxic-appearing or diaphoretic.  HENT:     Head: Normocephalic and atraumatic.     Right Ear: Tympanic membrane, ear canal and external ear normal. No drainage or tenderness. No middle ear effusion. There is no impacted cerumen. Tympanic membrane is not erythematous or bulging.     Left Ear: Tympanic membrane, ear canal and external ear normal. No drainage or tenderness.  No middle ear effusion. There is no impacted cerumen. Tympanic membrane is not erythematous or bulging.     Nose: Nose normal. No congestion or rhinorrhea.     Mouth/Throat:     Mouth: Mucous membranes are moist. No oral lesions.     Pharynx: No pharyngeal swelling, oropharyngeal exudate, posterior oropharyngeal erythema or uvula swelling.     Tonsils: No tonsillar exudate or tonsillar abscesses.  Eyes:     General: No scleral icterus.       Right eye: No discharge.        Left eye: No discharge.     Extraocular Movements: Extraocular movements intact.  Right eye: Normal extraocular motion.     Left eye: Normal extraocular motion.     Conjunctiva/sclera: Conjunctivae normal.  Cardiovascular:     Rate and Rhythm: Normal rate and regular rhythm.     Heart sounds: Normal heart sounds. No murmur heard.    No friction rub. No gallop.  Pulmonary:     Effort: Pulmonary effort is normal. No respiratory distress.     Breath sounds: No stridor. No wheezing, rhonchi or rales.  Chest:     Chest wall: No tenderness.  Musculoskeletal:     Cervical back: Normal range of motion and neck supple.  Lymphadenopathy:     Cervical: No cervical adenopathy.  Skin:    General: Skin is warm and dry.  Neurological:     General: No focal deficit present.     Mental Status: She is alert and oriented to person, place, and time.   Psychiatric:        Mood and Affect: Mood normal.        Behavior: Behavior normal.        Thought Content: Thought content normal.        Judgment: Judgment normal.     Assessment and Plan :   PDMP not reviewed this encounter.  1. Acute viral syndrome     COVID and flu test pending.  We will otherwise manage for viral upper respiratory infection.  Physical exam findings reassuring and vital signs stable for discharge. Advised supportive care, offered symptomatic relief. Counseled patient on potential for adverse effects with medications prescribed/recommended today, ER and return-to-clinic precautions discussed, patient verbalized understanding.      Wallis Bamberg, PA-C 10/24/22 1053

## 2022-10-24 NOTE — ED Triage Notes (Signed)
Pt states that she has some chills, loss of appetite, and bilateral eye pain. X5 days

## 2022-10-26 DIAGNOSIS — B009 Herpesviral infection, unspecified: Secondary | ICD-10-CM

## 2022-10-26 MED ORDER — VALACYCLOVIR HCL 500 MG PO TABS: ORAL_TABLET | ORAL | 0 refills | Status: DC

## 2022-10-26 NOTE — Telephone Encounter (Signed)
Please confirm Rx is correct or update/change if needed. Thanks.

## 2022-11-20 ENCOUNTER — Ambulatory Visit: Payer: 59 | Admitting: Radiology

## 2022-12-25 DIAGNOSIS — B009 Herpesviral infection, unspecified: Secondary | ICD-10-CM

## 2022-12-25 MED ORDER — VALACYCLOVIR HCL 500 MG PO TABS: ORAL_TABLET | ORAL | 1 refills | Status: DC

## 2022-12-25 NOTE — Telephone Encounter (Signed)
Last AEX 05/29/2022--recall placed for 2024.  Please confirm if ok to send larger refill per request (Rx pend) or if still would like to culture the next time pt has outbreak? Or both. Thanks.

## 2023-01-04 ENCOUNTER — Other Ambulatory Visit: Payer: Self-pay | Admitting: Radiology

## 2023-01-04 DIAGNOSIS — B009 Herpesviral infection, unspecified: Secondary | ICD-10-CM

## 2023-01-08 ENCOUNTER — Other Ambulatory Visit: Payer: Self-pay | Admitting: Radiology

## 2023-01-08 DIAGNOSIS — B009 Herpesviral infection, unspecified: Secondary | ICD-10-CM

## 2023-01-08 NOTE — Telephone Encounter (Signed)
Pharmacy comment: REQUEST FOR 90 DAYS PRESCRIPTION. DX Code Needed.   ICD 10 code added.

## 2023-01-25 ENCOUNTER — Other Ambulatory Visit: Payer: Self-pay | Admitting: Radiology

## 2023-01-25 DIAGNOSIS — B009 Herpesviral infection, unspecified: Secondary | ICD-10-CM

## 2023-01-28 NOTE — Telephone Encounter (Signed)
Refill request to change from 180 to a 90 day supply from pharmacy as a repeat request. Was last filled two weeks ago.

## 2023-05-20 ENCOUNTER — Other Ambulatory Visit: Payer: Self-pay

## 2023-05-20 DIAGNOSIS — B009 Herpesviral infection, unspecified: Secondary | ICD-10-CM

## 2023-05-20 MED ORDER — VALACYCLOVIR HCL 500 MG PO TABS: ORAL_TABLET | ORAL | 1 refills | Status: AC

## 2023-05-20 NOTE — Telephone Encounter (Signed)
Med refill request: pt requested Valtrex in Triage box Last AEX: 05/29/22 Next AEX: not scheduled Last MMG (if hormonal med) n/a Refill authorized: Please Advise, #180, 1 RF, leaving msg for pt to schedule annual

## 2023-06-14 ENCOUNTER — Ambulatory Visit: Payer: 59 | Admitting: Radiology

## 2023-07-26 ENCOUNTER — Other Ambulatory Visit (HOSPITAL_COMMUNITY)
Admission: RE | Admit: 2023-07-26 | Discharge: 2023-07-26 | Disposition: A | Discharge status: Home / Self Care | Payer: 59 | Source: Ambulatory Visit | Attending: Radiology | Admitting: Radiology

## 2023-07-26 ENCOUNTER — Ambulatory Visit (INDEPENDENT_AMBULATORY_CARE_PROVIDER_SITE_OTHER): Payer: 59 | Admitting: Radiology

## 2023-07-26 ENCOUNTER — Encounter: Payer: Self-pay | Admitting: Radiology

## 2023-07-26 VITALS — BP 124/72 | HR 87 | Ht 64.0 in | Wt 188.2 lb

## 2023-07-26 DIAGNOSIS — Z01419 Encounter for gynecological examination (general) (routine) without abnormal findings: Secondary | ICD-10-CM | POA: Diagnosis not present

## 2023-07-26 DIAGNOSIS — Z113 Encounter for screening for infections with a predominantly sexual mode of transmission: Secondary | ICD-10-CM | POA: Diagnosis present

## 2023-07-26 DIAGNOSIS — Z975 Presence of (intrauterine) contraceptive device: Secondary | ICD-10-CM | POA: Diagnosis not present

## 2023-07-26 NOTE — Progress Notes (Signed)
Raven Collins Jun 07, 1996 [id number]   History:  27 y.o. G0 presents for annual exam. No gyn concerns. Happy with Mirena, occasional BTB. Has a new sex partner, elects for STI screen with pap.  Gynecologic History Patient's last menstrual period was 07/18/2023.   Contraception/Family planning: IUD Sexually active: yes Last Pap: 2021. Results were: normal   Obstetric History OB History  Gravida Para Term Preterm AB Living  0 0 0 0 0 0  SAB IAB Ectopic Multiple Live Births  0 0 0 0       The following portions of the patient's history were reviewed and updated as appropriate: allergies, current medications, past family history, past medical history, past social history, past surgical history, and problem list.  Review of Systems Pertinent items noted in HPI and remainder of comprehensive ROS otherwise negative.   Past medical history, past surgical history, family history and social history were all reviewed and documented in the EPIC chart.   Exam:  Vitals:   07/26/23 1528  BP: 124/72  Pulse: 87  SpO2: 100%  Weight: 188 lb 3.2 oz (85.4 kg)  Height: 5\' 4"  (1.626 m)   Body mass index is 32.3 kg/m.  General appearance:  Normal Thyroid:  Symmetrical, normal in size, without palpable masses or nodularity. Respiratory  Auscultation:  Clear without wheezing or rhonchi Cardiovascular  Auscultation:  Regular rate, without rubs, murmurs or gallops  Edema/varicosities:  Not grossly evident Abdominal  Soft,nontender, without masses, guarding or rebound.  Liver/spleen:  No organomegaly noted  Hernia:  None appreciated  Skin  Inspection:  Grossly normal Breasts: Examined lying and sitting.   Right: Without masses, retractions, nipple discharge or axillary adenopathy.   Left: Without masses, retractions, nipple discharge or axillary adenopathy. Genitourinary   Inguinal/mons:  Normal without inguinal adenopathy  External genitalia:  Normal appearing vulva with no masses,  tenderness, or lesions  BUS/Urethra/Skene's glands:  Normal without masses or exudate  Vagina:  Normal appearing with normal color and discharge, no lesions  Cervix:  Normal appearing without discharge or lesions  Uterus:  Normal in size, shape and contour.  Mobile, nontender  Adnexa/parametria:     Rt: Normal in size, without masses or tenderness.   Lt: Normal in size, without masses or tenderness.  Anus and perineum: Normal   Raynelle Fanning, CMA present for exam  Assessment/Plan:   1. Well woman exam with routine gynecological exam - Cytology - PAP( [place])  2. Screening examination for sexually transmitted disease - Cytology - PAP( [place])  3. IUD (intrauterine device) in place Mirena may remain until 2028     Discussed SBE, pap and STI screening as directed/appropriate. Recommend of exercise weekly, including weight bearing exercise. Encouraged the use of seatbelts and sunscreen. Return in 1 year for annual or as needed.   Arlie Solomons B WHNP-BC 3:37 PM 07/26/2023

## 2023-08-01 LAB — CYTOLOGY - PAP: Diagnosis: NEGATIVE

## 2024-04-28 ENCOUNTER — Ambulatory Visit

## 2024-04-30 ENCOUNTER — Ambulatory Visit

## 2024-04-30 VITALS — BP 115/78 | HR 108 | Ht 63.0 in | Wt 202.0 lb

## 2024-04-30 DIAGNOSIS — R635 Abnormal weight gain: Secondary | ICD-10-CM

## 2024-04-30 DIAGNOSIS — E66812 Obesity, class 2: Secondary | ICD-10-CM

## 2024-04-30 DIAGNOSIS — Z6835 Body mass index (BMI) 35.0-35.9, adult: Secondary | ICD-10-CM | POA: Diagnosis not present

## 2024-04-30 DIAGNOSIS — F419 Anxiety disorder, unspecified: Secondary | ICD-10-CM | POA: Diagnosis not present

## 2024-04-30 MED ORDER — TIRZEPATIDE-WEIGHT MANAGEMENT 2.5 MG/0.5ML ~~LOC~~ SOLN: 2.5000 mg | SUBCUTANEOUS | 1 refills | Status: DC

## 2024-04-30 MED ORDER — SERTRALINE HCL 25 MG PO TABS: 25.0000 mg | ORAL_TABLET | Freq: Every day | ORAL | 3 refills | Status: DC

## 2024-04-30 NOTE — Progress Notes (Signed)
 New Patient Office Visit  Subjective    Patient ID: Raven Collins, female    DOB: Jun 26, 1996  Age: 28 y.o. MRN: [id number]  CC:  Chief Complaint  Patient presents with   Establish Care    Pt here to establish care also possible blood work and pt wants to talk about possible refferals to a endocrinologist and psychiatrist    HPI Raven Collins presents to establish care and to address the following issues:   Anxiety:  She states that she has been experiencing ongoing anxiety and irritabilty which she states is getting worse over time.  She denies any event or changes that have brought on anxiety.  She states that it has been present for years, but she was able to manage it previously.  States that she works for city of [employer] in [employer]  Also reports recently buying a house.  Lives with her partner.   She does endorse that her mom has anxiety, thyroid issues and type 2 diabetes.    Weight: She states that she has been putting on weight consistently in spite of eating healthy and working out regularly.  She states that her mom also has weight issues.   3 years ago she weighed 164 lbs, per ob/gyn OV on 07/20/21. She was on Mirena at that time as well.    Overweight/Obesity Complicated by N/A:  New goal.  Unable to achieve goal weight loss through lifestyle modification alone; current treatment:diet and exercise.    Medications/Strategies previously tried: healthy diet and regular exercise.   Baseline weight: 164; most recent weight: 202  Current meal patterns: breakfast: eggs, yogurt, fruit; lunch: grilled chicken salad; dinner: pasta, fish or chicken; snacks: fruits, veggies, nuts; drinks: mainly water, occasional decaf tea  Current exercise: works out at gym 3-4 days a week.   Extensive dietary counseling including education on focus on lean proteins, fruits and vegetables, whole grains and increased fiber consumption, adequate hydration  Extensive exercise counseling  including eventual goal of 150 minutes of moderate intensity exercise weekly; role of resistance training; breaking up prolonged sedentary time  Educated on diet and healthy habits Counseled on increasing exercise intensity and variety Recommended checking labs for secondary cause of weight gain. Add Zepbound  d/t family hx of obesity and type 2 diabetes in mom, as well as increased anxiety from inability to lose weight.     Outpatient Encounter Medications as of 04/30/2024  Medication Sig   levonorgestrel (MIRENA) 20 MCG/DAY IUD 1 each by Intrauterine route once. Inserted 09/2019   sertraline  (ZOLOFT ) 25 MG tablet Take 1 tablet (25 mg total) by mouth daily.   tirzepatide  (ZEPBOUND ) 2.5 MG/0.5ML injection vial Inject 2.5 mg into the skin once a week.   valACYclovir  (VALTREX ) 500 MG tablet TAKE 1 TABLET BY MOUTH TWICE A DAY FOR 3-5 DAYS AS NEEDED FOR BREAK OUT.   No facility-administered encounter medications on file as of 04/30/2024.    Past Medical History:  Diagnosis Date   Contraceptive management 08/29/2015    Past Surgical History:  Procedure Laterality Date   FOOT SURGERY Left 2012    Family History  Problem Relation Age of Onset   Thyroid disease Mother    Migraines Mother    Alcohol abuse Maternal Aunt    Depression Maternal Aunt    Alcohol abuse Maternal Uncle    Depression Maternal Uncle    Thyroid disease Maternal Grandmother    Other Maternal Grandmother        aorta  aneursym   Other Brother        hit by car   Alcohol abuse Maternal Uncle    Depression Maternal Uncle    Alcohol abuse Maternal Uncle     Social History   Socioeconomic History   Marital status: Single    Spouse name: Not on file   Number of children: Not on file   Years of education: Not on file   Highest education level: Not on file  Occupational History   Not on file  Tobacco Use   Smoking status: Never   Smokeless tobacco: Never  Substance and Sexual Activity   Alcohol use: Yes     Comment: occ   Drug use: No   Sexual activity: Yes    Partners: Male    Birth control/protection: I.U.D.    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  Other Topics Concern   Not on file  Social History Narrative   Not on file   Social Drivers of Health   Financial Resource Strain: Not on file  Food Insecurity: No Food Insecurity (07/20/2021)   [facility]   Hunger Vital Sign    Within the past 12 months, you worried that your food would run out before you got the money to buy more.: Never true    Within the past 12 months, the food you bought just didn't last and you didn't have money to get more.: Never true  Transportation Needs: Not on file  Physical Activity: Not on file  Stress: Not on file  Social Connections: Unknown (03/23/2022)   [facility]   Social Network    Social Network: Not on file  Intimate Partner Violence: Unknown (02/13/2022)   Received from [facility]   HITS    Physically Hurt: Not on file    Insult or Talk Down To: Not on file    Threaten Physical Harm: Not on file    Scream or Curse: Not on file    ROS      Objective    BP 115/78   Pulse (!) 108   Ht 5' 3 (1.6 m)   Wt 202 lb (91.6 kg)   SpO2 95%   BMI 35.78 kg/m   Physical Exam Vitals and nursing note reviewed.  Constitutional:      Appearance: Normal appearance. She is obese.  HENT:     Head: Normocephalic.     Right Ear: Tympanic membrane, ear canal and external ear normal.     Left Ear: Tympanic membrane, ear canal and external ear normal.     Nose: Nose normal.     Mouth/Throat:     Mouth: Mucous membranes are moist.     Pharynx: Oropharynx is clear.   Cardiovascular:     Rate and Rhythm: Normal rate and regular rhythm.  Pulmonary:     Effort: Pulmonary effort is normal.     Breath sounds: Normal breath sounds.   Musculoskeletal:        General: Normal range of motion.     Cervical back: Normal range of motion and neck  supple.   Skin:    General: Skin is warm and dry.   Neurological:     Mental Status: She is alert and oriented to person, place, and time.   Psychiatric:        Mood and Affect: Mood normal.        Thought Content: Thought content normal.  Assessment & Plan:   Problem List Items Addressed This Visit       Other   Weight gain - Primary   Relevant Orders   TSH + free T4 (Completed)   Basic Metabolic Panel (BMET) (Completed)   Cortisol (Completed)   HgB A1c (Completed)   Vitamin D  (25 hydroxy) (Completed)   Anxiety   Through discussion of treatment options and mutual agreement, she wishes to proceed with addition of medication for ongoing anxiety.  She denies any SI at this time.  We also discussed ways to manage stress and anxiety, including meditation, exercise, journaling or engaging in hobbies.  Discussed possible s/e from medication. Recommend f/u in 2-3 months or sooner if needed.          Relevant Medications   sertraline  (ZOLOFT ) 25 MG tablet   Other Relevant Orders   TSH + free T4 (Completed)   Obesity, Class II, BMI 35-39.9   Add prescription for Zepbound .  The patient is advised to make improvements to  diet and exercise patterns to aid in medical management of this problem.  Recommend f/u in 2-3 months or sooner if experiencing adverse side effects.       Relevant Medications   tirzepatide  (ZEPBOUND ) 2.5 MG/0.5ML injection vial    Return in about 2 months (around 06/30/2024) for for recheck of symptoms.   Leita Longs, FNP

## 2024-05-01 ENCOUNTER — Other Ambulatory Visit (HOSPITAL_COMMUNITY): Payer: Self-pay

## 2024-05-01 ENCOUNTER — Telehealth: Payer: Self-pay | Admitting: Pharmacy Technician

## 2024-05-01 LAB — HEMOGLOBIN A1C
Est. average glucose Bld gHb Est-mCnc: 88 mg/dL
Hgb A1c MFr Bld: 4.7 % — ABNORMAL LOW (ref 4.8–5.6)

## 2024-05-01 LAB — BASIC METABOLIC PANEL WITH GFR
BUN/Creatinine Ratio: 11 (ref 9–23)
BUN: 9 mg/dL (ref 6–20)
CO2: 19 mmol/L — ABNORMAL LOW (ref 20–29)
Calcium: 9.4 mg/dL (ref 8.7–10.2)
Chloride: 103 mmol/L (ref 96–106)
Creatinine, Ser: 0.84 mg/dL (ref 0.57–1.00)
Glucose: 85 mg/dL (ref 70–99)
Potassium: 4.7 mmol/L (ref 3.5–5.2)
Sodium: 137 mmol/L (ref 134–144)
eGFR: 98 mL/min/{1.73_m2} (ref >59)

## 2024-05-01 LAB — TSH+FREE T4
Free T4: 1.36 ng/dL (ref 0.82–1.77)
TSH: 1.36 u[IU]/mL (ref 0.450–4.500)

## 2024-05-01 LAB — CORTISOL: Cortisol: 8.3 ug/dL (ref 6.2–19.4)

## 2024-05-01 LAB — VITAMIN D 25 HYDROXY (VIT D DEFICIENCY, FRACTURES): Vit D, 25-Hydroxy: 26.2 ng/mL — ABNORMAL LOW (ref 30.0–100.0)

## 2024-05-01 NOTE — Telephone Encounter (Signed)
 Pharmacy Patient Advocate Encounter   Received notification from Patient Advice Request messages that prior authorization for Zepbound 2.5MG /0.5ML pen-injectors is required/requested.   Insurance verification completed.   The patient is insured through [facility] .   Per test claim: PA required; PA submitted to above mentioned insurance via CoverMyMeds Key/confirmation #/EOC [id number] Status is pending

## 2024-05-01 NOTE — Telephone Encounter (Signed)
 PA request has been Started. New Encounter has been or will be created for follow up. For additional info see Pharmacy Prior Auth telephone encounter from 05/01/2024.

## 2024-05-01 NOTE — Telephone Encounter (Signed)
 Pharmacy Patient Advocate Encounter  Received notification from [employer] that Prior Authorization for Zepbound 2.5MG /0.5ML pen-injectors has been APPROVED from 05/01/2024 to 10/31/2024. Ran test claim, Copay is $24.99. This test claim was processed through [facility]- copay amounts may vary at other pharmacies due to pharmacy/plan contracts, or as the patient moves through the different stages of their insurance plan.   PA #/Case ID/Reference #: [id number]

## 2024-05-02 DIAGNOSIS — E66812 Obesity, class 2: Secondary | ICD-10-CM | POA: Insufficient documentation

## 2024-05-02 DIAGNOSIS — R635 Abnormal weight gain: Secondary | ICD-10-CM | POA: Insufficient documentation

## 2024-05-02 DIAGNOSIS — F419 Anxiety disorder, unspecified: Secondary | ICD-10-CM | POA: Insufficient documentation

## 2024-05-02 NOTE — Assessment & Plan Note (Signed)
 Through discussion of treatment options and mutual agreement, she wishes to proceed with addition of medication for ongoing anxiety.  She denies any SI at this time.  We also discussed ways to manage stress and anxiety, including meditation, exercise, journaling or engaging in hobbies.  Discussed possible s/e from medication. Recommend f/u in 2-3 months or sooner if needed.

## 2024-05-02 NOTE — Assessment & Plan Note (Signed)
 Add prescription for Zepbound .  The patient is advised to make improvements to  diet and exercise patterns to aid in medical management of this problem.  Recommend f/u in 2-3 months or sooner if experiencing adverse side effects.

## 2024-05-05 ENCOUNTER — Other Ambulatory Visit: Payer: Self-pay

## 2024-05-05 DIAGNOSIS — E66812 Obesity, class 2: Secondary | ICD-10-CM

## 2024-05-06 ENCOUNTER — Other Ambulatory Visit: Payer: Self-pay

## 2024-05-06 MED ORDER — SEMAGLUTIDE-WEIGHT MANAGEMENT 1.7 MG/0.75ML ~~LOC~~ SOAJ: 1.7000 mg | SUBCUTANEOUS | 0 refills | Status: DC

## 2024-05-06 MED ORDER — SEMAGLUTIDE-WEIGHT MANAGEMENT 0.25 MG/0.5ML ~~LOC~~ SOAJ: 0.2500 mg | SUBCUTANEOUS | 0 refills | Status: AC

## 2024-05-06 MED ORDER — SEMAGLUTIDE-WEIGHT MANAGEMENT 1 MG/0.5ML ~~LOC~~ SOAJ: 1.0000 mg | SUBCUTANEOUS | 0 refills | Status: DC

## 2024-05-06 MED ORDER — SEMAGLUTIDE-WEIGHT MANAGEMENT 0.5 MG/0.5ML ~~LOC~~ SOAJ: 0.5000 mg | SUBCUTANEOUS | 0 refills | Status: AC

## 2024-05-06 MED ORDER — SEMAGLUTIDE-WEIGHT MANAGEMENT 2.4 MG/0.75ML ~~LOC~~ SOAJ: 2.4000 mg | SUBCUTANEOUS | 0 refills | Status: DC

## 2024-05-21 ENCOUNTER — Ambulatory Visit: Payer: Self-pay

## 2024-05-27 ENCOUNTER — Other Ambulatory Visit: Payer: Self-pay

## 2024-05-27 DIAGNOSIS — F419 Anxiety disorder, unspecified: Secondary | ICD-10-CM

## 2024-07-07 ENCOUNTER — Other Ambulatory Visit: Payer: Self-pay

## 2024-07-07 DIAGNOSIS — E66812 Obesity, class 2: Secondary | ICD-10-CM

## 2024-07-07 MED ORDER — WEGOVY 0.25 MG/0.5ML ~~LOC~~ SOAJ: 0.2500 mg | SUBCUTANEOUS | 1 refills | Status: DC

## 2024-07-08 ENCOUNTER — Ambulatory Visit

## 2024-07-08 VITALS — BP 125/76 | HR 93 | Ht 64.0 in | Wt 198.0 lb

## 2024-07-08 DIAGNOSIS — E66812 Obesity, class 2: Secondary | ICD-10-CM

## 2024-07-08 DIAGNOSIS — F419 Anxiety disorder, unspecified: Secondary | ICD-10-CM | POA: Diagnosis not present

## 2024-07-08 MED ORDER — WEGOVY 0.25 MG/0.5ML ~~LOC~~ SOAJ: 0.2500 mg | SUBCUTANEOUS | 1 refills | Status: DC

## 2024-07-08 NOTE — Progress Notes (Signed)
   Established Patient Office Visit  Subjective   Patient ID: Raven Collins, female    DOB: [birth year]  Age: 28 y.o. 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  Constitutional:      Appearance: Normal appearance.  HENT:     Head: Normocephalic.  Eyes:     Extraocular Movements: Extraocular movements intact.     Pupils: Pupils are equal, round, and reactive to light.  Cardiovascular:     Rate and Rhythm: Normal rate and regular rhythm.  Pulmonary:     Effort: Pulmonary effort is normal.     Breath sounds: Normal breath sounds.  Musculoskeletal:     Cervical back: Normal range of motion and neck supple.  Neurological:     Mental Status: She is alert and oriented to person, place, and time.  Psychiatric:        Mood and Affect: Mood normal.        Thought Content: Thought content normal.      No results found for any visits on 07/08/24.    The ASCVD Risk score (Arnett DK, et al., 2019) failed to calculate for the following reasons:   The 2019 ASCVD risk score is only valid for ages 17 to 104    Assessment & Plan:   Problem List Items Addressed This Visit       Other   Anxiety - Primary   She reports improvement of anxiety  since addition of sertraline  25 mg.  She declines increase in dose at this time.  Will      Obesity, Class II, BMI 35-39.9   She has new health insurance and wishes to follow-up with try to get Wegovy  approved for weight loss.  Prescription submitted..      Relevant Medications   semaglutide -weight management (WEGOVY ) 0.25 MG/0.5ML SOAJ SQ injection    Return in about 3 months (around 10/08/2024) for chronic follow-up with PCP.    Leita Longs, FNP

## 2024-07-09 ENCOUNTER — Other Ambulatory Visit: Payer: Self-pay

## 2024-07-09 DIAGNOSIS — E66812 Obesity, class 2: Secondary | ICD-10-CM

## 2024-07-13 NOTE — Assessment & Plan Note (Signed)
 She reports improvement of anxiety since addition of sertraline  25 mg.  She declines increase in dose at this time.  Will

## 2024-07-13 NOTE — Assessment & Plan Note (Signed)
 She has new health insurance and wishes to follow-up with try to get Wegovy  approved for weight loss.  Prescription submitted.SABRA

## 2024-07-14 ENCOUNTER — Other Ambulatory Visit (HOSPITAL_COMMUNITY): Payer: Self-pay

## 2024-07-14 ENCOUNTER — Telehealth: Payer: Self-pay | Admitting: Pharmacy Technician

## 2024-07-14 NOTE — Telephone Encounter (Signed)
 PA request has been Received. New Encounter has been or will be created for follow up. For additional info see Pharmacy Prior Auth telephone encounter from 07/14/2024.

## 2024-07-14 NOTE — Telephone Encounter (Signed)
 Pharmacy Patient Advocate Encounter   Received notification from Patient Advice Request messages that prior authorization for Wegovy  0.25mg /0.60ml auto-injectors is required/requested.   Insurance verification completed.   The patient is insured through [facility] .   Per test claim: Not covered for weight loss. Only covered for CV risk reduction with one of the following.  Does pt have evidence of any of the following: Myocardial infarction (MI), (2) Prior ischemic or hemorrhagic stroke, (3) Symptomatic peripheral arterial disease (PAD) as evidence by one of the following (a) Intermittent claudication with ankle-brachial index (ABI) less than 0.85 (at rest); (b) Peripheral arterial revascularization procedure; (c) Amputation due to atherosclerotic disease?

## 2024-07-14 NOTE — Telephone Encounter (Signed)
 PA request has been Received. New Encounter has been or will be created for follow up. For additional info see Pharmacy telephone encounter from 07/14/2024.

## 2024-07-21 ENCOUNTER — Other Ambulatory Visit (HOSPITAL_COMMUNITY): Payer: Self-pay

## 2024-07-21 NOTE — Telephone Encounter (Signed)
 PA request has been Received. New Encounter has been or will be created for follow up. For additional info see Pharmacy telephone encounter from 07/14/2024.  Not covered when used for weight loss. Pt has to have one of the following in order to be approved for CV risk reduction.  Does pt have evidence of any of the following: Myocardial infarction (MI), (2) Prior ischemic or hemorrhagic stroke, (3) Symptomatic peripheral arterial disease (PAD) as evidence by one of the following (a) Intermittent claudication with ankle-brachial index (ABI) less than 0.85 (at rest); (b) Peripheral arterial revascularization procedure; (c) Amputation due to atherosclerotic disease?

## 2024-07-27 NOTE — Telephone Encounter (Signed)
 Not covered for weight loss. She would have to meet the criteria below for coverage for CV risk reduction.   Does pt have evidence of any of the following: Myocardial infarction (MI), (2) Prior ischemic or hemorrhagic stroke, (3) Symptomatic peripheral arterial disease (PAD) as evidence by one of the following (a) Intermittent claudication with ankle-brachial index (ABI) less than 0.85 (at rest); (b) Peripheral arterial revascularization procedure; (c) Amputation due to atherosclerotic disease?

## 2024-10-14 ENCOUNTER — Other Ambulatory Visit (HOSPITAL_COMMUNITY): Payer: Self-pay

## 2024-10-14 ENCOUNTER — Telehealth: Payer: Self-pay | Admitting: Pharmacy Technician

## 2024-10-14 NOTE — Telephone Encounter (Signed)
 Pharmacy Patient Advocate Encounter   Received notification from CoverMyMeds that prior authorization for Zepbound  2.5MG /0.5ML pen-injectors is due for renewal.   Insurance verification completed.   The patient is insured through [facility].  Action: Medication has been discontinued. Archived Key: [id number]

## 2024-10-15 ENCOUNTER — Other Ambulatory Visit (HOSPITAL_COMMUNITY): Payer: Self-pay

## 2024-12-03 ENCOUNTER — Other Ambulatory Visit: Payer: Self-pay

## 2024-12-03 DIAGNOSIS — E66812 Obesity, class 2: Secondary | ICD-10-CM

## 2024-12-03 MED ORDER — WEGOVY 1.5 MG PO TABS: 1.5000 mg | ORAL_TABLET | Freq: Every day | ORAL | 2 refills | Status: AC
